# Patient Record
Sex: Male | Born: 1990 | Race: White | Hispanic: No | Marital: Married | Smoking: Never smoker
Health system: Southern US, Community
[De-identification: ages and names within clinical notes are randomized; demographics above are authoritative.]

---
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
## 2014-02-24 ENCOUNTER — Emergency Department (HOSPITAL_COMMUNITY): Payer: Worker's Compensation | Admitting: Anesthesiology

## 2014-02-24 ENCOUNTER — Emergency Department (HOSPITAL_COMMUNITY): Payer: Worker's Compensation

## 2014-02-24 ENCOUNTER — Ambulatory Visit (HOSPITAL_COMMUNITY)
Admission: EM | Admit: 2014-02-24 | Discharge: 2014-02-24 | Disposition: A | Discharge status: Home / Self Care | Payer: Worker's Compensation | Attending: Emergency Medicine | Admitting: Emergency Medicine

## 2014-02-24 ENCOUNTER — Encounter (HOSPITAL_COMMUNITY): Payer: Self-pay | Admitting: *Deleted

## 2014-02-24 ENCOUNTER — Encounter (HOSPITAL_COMMUNITY)
Admission: EM | Disposition: A | Discharge status: Home / Self Care | Payer: Self-pay | Source: Home / Self Care | Attending: Emergency Medicine

## 2014-02-24 DIAGNOSIS — Y929 Unspecified place or not applicable: Secondary | ICD-10-CM | POA: Insufficient documentation

## 2014-02-24 DIAGNOSIS — IMO0002 Reserved for concepts with insufficient information to code with codable children: Secondary | ICD-10-CM

## 2014-02-24 DIAGNOSIS — Z8614 Personal history of Methicillin resistant Staphylococcus aureus infection: Secondary | ICD-10-CM | POA: Insufficient documentation

## 2014-02-24 DIAGNOSIS — W260XXA Contact with knife, initial encounter: Secondary | ICD-10-CM | POA: Diagnosis not present

## 2014-02-24 DIAGNOSIS — F17220 Nicotine dependence, chewing tobacco, uncomplicated: Secondary | ICD-10-CM | POA: Insufficient documentation

## 2014-02-24 DIAGNOSIS — T148 Other injury of unspecified body region: Secondary | ICD-10-CM | POA: Diagnosis present

## 2014-02-24 DIAGNOSIS — S66822A Laceration of other specified muscles, fascia and tendons at wrist and hand level, left hand, initial encounter: Secondary | ICD-10-CM | POA: Diagnosis not present

## 2014-02-24 HISTORY — PX: I & D EXTREMITY: SHX5045

## 2014-02-24 LAB — SURGICAL PCR SCREEN
MRSA, PCR: NEGATIVE
STAPHYLOCOCCUS AUREUS: NEGATIVE

## 2014-02-24 SURGERY — IRRIGATION AND DEBRIDEMENT EXTREMITY
Anesthesia: General | Site: Hand | Laterality: Left

## 2014-02-24 MED ORDER — OXYCODONE HCL 5 MG/5ML PO SOLN: 5.0000 mg | Freq: Once | ORAL | Status: AC | PRN

## 2014-02-24 MED ORDER — OXYCODONE-ACETAMINOPHEN 5-325 MG PO TABS
ORAL_TABLET | ORAL | Status: AC
  Filled 2014-02-24: qty 1

## 2014-02-24 MED ORDER — FENTANYL CITRATE 0.05 MG/ML IJ SOLN
100.0000 ug | Freq: Once | INTRAMUSCULAR | Status: AC
  Administered 2014-02-24: 100 ug via INTRAVENOUS

## 2014-02-24 MED ORDER — SULFAMETHOXAZOLE-TRIMETHOPRIM 800-160 MG PO TABS: 1.0000 | ORAL_TABLET | Freq: Two times a day (BID) | ORAL | Status: AC

## 2014-02-24 MED ORDER — ONDANSETRON HCL 4 MG/2ML IJ SOLN
INTRAMUSCULAR | Status: DC | PRN
  Administered 2014-02-24: 4 mg via INTRAVENOUS

## 2014-02-24 MED ORDER — MIDAZOLAM HCL 5 MG/5ML IJ SOLN
INTRAMUSCULAR | Status: DC | PRN
  Administered 2014-02-24: 2 mg via INTRAVENOUS

## 2014-02-24 MED ORDER — FENTANYL CITRATE 0.05 MG/ML IJ SOLN
INTRAMUSCULAR | Status: DC | PRN
  Administered 2014-02-24: 50 ug via INTRAVENOUS

## 2014-02-24 MED ORDER — BUPIVACAINE HCL (PF) 0.25 % IJ SOLN
INTRAMUSCULAR | Status: DC | PRN
  Administered 2014-02-24: 10 mL

## 2014-02-24 MED ORDER — FENTANYL CITRATE 0.05 MG/ML IJ SOLN
INTRAMUSCULAR | Status: AC
  Filled 2014-02-24: qty 2

## 2014-02-24 MED ORDER — OXYCODONE HCL 5 MG PO TABS
5.0000 mg | ORAL_TABLET | Freq: Once | ORAL | Status: AC | PRN
  Administered 2014-02-24: 5 mg via ORAL

## 2014-02-24 MED ORDER — LACTATED RINGERS IV SOLN
INTRAVENOUS | Status: DC
  Administered 2014-02-24: 16:00:00 via INTRAVENOUS

## 2014-02-24 MED ORDER — PROPOFOL 10 MG/ML IV BOLUS
INTRAVENOUS | Status: AC
  Filled 2014-02-24: qty 20

## 2014-02-24 MED ORDER — HYDROMORPHONE HCL 1 MG/ML IJ SOLN
INTRAMUSCULAR | Status: AC
  Filled 2014-02-24: qty 1

## 2014-02-24 MED ORDER — PROPOFOL 10 MG/ML IV BOLUS
INTRAVENOUS | Status: DC | PRN
Start: 2014-02-24 — End: 2014-02-24
  Administered 2014-02-24: 250 mg via INTRAVENOUS

## 2014-02-24 MED ORDER — LIDOCAINE HCL (CARDIAC) 20 MG/ML IV SOLN
INTRAVENOUS | Status: AC
  Filled 2014-02-24: qty 5

## 2014-02-24 MED ORDER — GLYCOPYRROLATE 0.2 MG/ML IJ SOLN
INTRAMUSCULAR | Status: DC | PRN
  Administered 2014-02-24: 0.2 mg via INTRAVENOUS

## 2014-02-24 MED ORDER — HYDROMORPHONE HCL 1 MG/ML IJ SOLN
0.2500 mg | INTRAMUSCULAR | Status: DC | PRN
  Administered 2014-02-24 (×2): 0.5 mg via INTRAVENOUS

## 2014-02-24 MED ORDER — OXYCODONE HCL 5 MG PO TABS
ORAL_TABLET | ORAL | Status: AC
  Filled 2014-02-24: qty 1

## 2014-02-24 MED ORDER — FENTANYL CITRATE 0.05 MG/ML IJ SOLN
INTRAMUSCULAR | Status: AC
  Filled 2014-02-24: qty 5

## 2014-02-24 MED ORDER — FENTANYL CITRATE 0.05 MG/ML IJ SOLN
25.0000 ug | Freq: Once | INTRAMUSCULAR | Status: AC
  Administered 2014-02-24: 25 ug via INTRAVENOUS
  Filled 2014-02-24: qty 2

## 2014-02-24 MED ORDER — LACTATED RINGERS IV SOLN
INTRAVENOUS | Status: DC | PRN
  Administered 2014-02-24: 16:00:00 via INTRAVENOUS

## 2014-02-24 MED ORDER — MIDAZOLAM HCL 2 MG/2ML IJ SOLN
INTRAMUSCULAR | Status: AC
  Filled 2014-02-24: qty 2

## 2014-02-24 MED ORDER — SODIUM CHLORIDE 0.9 % IR SOLN
Status: DC | PRN
  Administered 2014-02-24: 1000 mL

## 2014-02-24 MED ORDER — CEFAZOLIN SODIUM-DEXTROSE 2-3 GM-% IV SOLR
INTRAVENOUS | Status: AC
  Filled 2014-02-24: qty 50

## 2014-02-24 MED ORDER — OXYCODONE-ACETAMINOPHEN 5-325 MG PO TABS
1.0000 | ORAL_TABLET | Freq: Once | ORAL | Status: AC
  Administered 2014-02-24: 1 via ORAL

## 2014-02-24 MED ORDER — MUPIROCIN 2 % EX OINT
1.0000 "application " | TOPICAL_OINTMENT | Freq: Once | CUTANEOUS | Status: AC
  Administered 2014-02-24: 1 via TOPICAL

## 2014-02-24 MED ORDER — CEFAZOLIN SODIUM-DEXTROSE 2-3 GM-% IV SOLR
2.0000 g | Freq: Once | INTRAVENOUS | Status: AC
  Administered 2014-02-24: 2 g via INTRAVENOUS
  Filled 2014-02-24: qty 50

## 2014-02-24 MED ORDER — BUPIVACAINE HCL (PF) 0.25 % IJ SOLN
INTRAMUSCULAR | Status: AC
  Filled 2014-02-24: qty 30

## 2014-02-24 MED ORDER — FENTANYL CITRATE 0.05 MG/ML IJ SOLN
INTRAMUSCULAR | Status: DC
Start: 2014-02-24 — End: 2014-02-24
  Filled 2014-02-24: qty 2

## 2014-02-24 MED ORDER — MUPIROCIN 2 % EX OINT
TOPICAL_OINTMENT | CUTANEOUS | Status: AC
  Administered 2014-02-24: 1 via TOPICAL
  Filled 2014-02-24: qty 22

## 2014-02-24 MED ORDER — OXYCODONE-ACETAMINOPHEN 5-325 MG PO TABS: 2.0000 | ORAL_TABLET | ORAL | Status: AC | PRN

## 2014-02-24 MED ORDER — CEFAZOLIN SODIUM-DEXTROSE 2-3 GM-% IV SOLR
INTRAVENOUS | Status: DC | PRN
  Administered 2014-02-24: 2 g via INTRAVENOUS

## 2014-02-24 MED ORDER — LIDOCAINE HCL (CARDIAC) 20 MG/ML IV SOLN
INTRAVENOUS | Status: DC | PRN
  Administered 2014-02-24: 80 mg via INTRAVENOUS

## 2014-02-24 SURGICAL SUPPLY — 43 items
BANDAGE ELASTIC 3 VELCRO ST LF (GAUZE/BANDAGES/DRESSINGS) ×2 IMPLANT
BANDAGE ELASTIC 4 VELCRO ST LF (GAUZE/BANDAGES/DRESSINGS) ×3 IMPLANT
BNDG CONFORM 2 STRL LF (GAUZE/BANDAGES/DRESSINGS) IMPLANT
BNDG GAUZE ELAST 4 BULKY (GAUZE/BANDAGES/DRESSINGS) ×5 IMPLANT
CORDS BIPOLAR (ELECTRODE) ×3 IMPLANT
CUFF TOURNIQUET SINGLE 18IN (TOURNIQUET CUFF) ×3 IMPLANT
DRSG ADAPTIC 3X8 NADH LF (GAUZE/BANDAGES/DRESSINGS) ×3 IMPLANT
ELECT REM PT RETURN 9FT ADLT (ELECTROSURGICAL)
ELECTRODE REM PT RTRN 9FT ADLT (ELECTROSURGICAL) IMPLANT
GAUZE SPONGE 4X4 12PLY STRL (GAUZE/BANDAGES/DRESSINGS) ×3 IMPLANT
GAUZE XEROFORM 1X8 LF (GAUZE/BANDAGES/DRESSINGS) ×3 IMPLANT
GLOVE BIOGEL M STRL SZ7.5 (GLOVE) ×3 IMPLANT
GLOVE SS BIOGEL STRL SZ 8 (GLOVE) ×1 IMPLANT
GLOVE SUPERSENSE BIOGEL SZ 8 (GLOVE) ×2
GOWN STRL REUS W/ TWL LRG LVL3 (GOWN DISPOSABLE) ×3 IMPLANT
GOWN STRL REUS W/ TWL XL LVL3 (GOWN DISPOSABLE) ×3 IMPLANT
GOWN STRL REUS W/TWL LRG LVL3 (GOWN DISPOSABLE) ×9
GOWN STRL REUS W/TWL XL LVL3 (GOWN DISPOSABLE) ×9
KIT BASIN OR (CUSTOM PROCEDURE TRAY) ×3 IMPLANT
KIT ROOM TURNOVER OR (KITS) ×3 IMPLANT
MANIFOLD NEPTUNE II (INSTRUMENTS) ×3 IMPLANT
NDL HYPO 25GX1X1/2 BEV (NEEDLE) IMPLANT
NEEDLE HYPO 25GX1X1/2 BEV (NEEDLE) IMPLANT
NS IRRIG 1000ML POUR BTL (IV SOLUTION) ×3 IMPLANT
PACK ORTHO EXTREMITY (CUSTOM PROCEDURE TRAY) ×3 IMPLANT
PAD ARMBOARD 7.5X6 YLW CONV (MISCELLANEOUS) ×6 IMPLANT
PAD CAST 4YDX4 CTTN HI CHSV (CAST SUPPLIES) ×1 IMPLANT
PADDING CAST ABS 3INX4YD NS (CAST SUPPLIES) ×2
PADDING CAST ABS COTTON 3X4 (CAST SUPPLIES) IMPLANT
PADDING CAST COTTON 4X4 STRL (CAST SUPPLIES) ×3
SPONGE GAUZE 4X4 12PLY STER LF (GAUZE/BANDAGES/DRESSINGS) ×2 IMPLANT
SPONGE LAP 18X18 X RAY DECT (DISPOSABLE) ×3 IMPLANT
SPONGE LAP 4X18 X RAY DECT (DISPOSABLE) ×3 IMPLANT
SUT FIBERWIRE 4-0 18 TAPR NDL (SUTURE) ×6
SUTURE FIBERWR 4-0 18 TAPR NDL (SUTURE) IMPLANT
SYR CONTROL 10ML LL (SYRINGE) IMPLANT
TOWEL OR 17X24 6PK STRL BLUE (TOWEL DISPOSABLE) ×3 IMPLANT
TOWEL OR 17X26 10 PK STRL BLUE (TOWEL DISPOSABLE) ×3 IMPLANT
TUBE CONNECTING 12'X1/4 (SUCTIONS) ×1
TUBE CONNECTING 12X1/4 (SUCTIONS) ×2 IMPLANT
TUBING CYSTO DISP (UROLOGICAL SUPPLIES) ×2 IMPLANT
WATER STERILE IRR 1000ML POUR (IV SOLUTION) ×3 IMPLANT
YANKAUER SUCT BULB TIP NO VENT (SUCTIONS) ×1 IMPLANT

## 2014-02-24 NOTE — Transfer of Care (Signed)
Immediate Anesthesia Transfer of Care Note  Patient: Clarence Cameron  Procedure(s) Performed: Procedure(s): IRRIGATION AND DEBRIDEMENT LEFT HAND, EXPLORATION, TENDON REPAIR (Left)  Patient Location: PACU  Anesthesia Type:General  Level of Consciousness: sedated  Airway & Oxygen Therapy: Patient Spontanous Breathing and Patient connected to nasal cannula oxygen  Post-op Assessment: Report given to PACU RN, Post -op Vital signs reviewed and stable and Patient moving all extremities X 4  Post vital signs: Reviewed and stable  Complications: No apparent anesthesia complications

## 2014-02-24 NOTE — ED Notes (Signed)
Manual pressure applied to wound area per RN

## 2014-02-24 NOTE — H&P (Signed)
Clarence KaufmannJustin R Cameron is an 24 y.o. male.   Chief Complaint: Cut my left hand HPI: The patient is a pleasant 24 yo male presents for evaluation of his left hand after an [employer], the patient cut the first web space of the left hand while cutting foam board insulation. Patient noted significant bleeding to the area and was brought to the ER for further evaluation. Initial evaluation per the ER staff was concerning for arterial bleed. Thus pressure dressing applied. The patient remained hemodynamically stable. On our current evaluation, he denies numbness or tingling of the digits. He denies any other injury. Tetanus UTD(within 2 years). Ancef 2 grams currently being administrated. Patient has no other complaints.  History reviewed. No pertinent past medical history.  History reviewed. No pertinent past surgical history.  No family history on file. Social History:  reports that he has never smoked. His smokeless tobacco use includes Snuff. He reports that he does not drink alcohol or use illicit drugs.  Allergies: No Known Allergies   (Not in a hospital admission)  No results found for this or any previous visit (from the past 48 hour(s)). Dg Hand Complete Left  02/24/2014   CLINICAL DATA:  Open injury left hand today. Injured with knife. Initial evaluation.  EXAM: LEFT HAND - COMPLETE 3+ VIEW  COMPARISON:  None.  FINDINGS: Soft tissue swelling with subcutaneous air noted over the interspace between the left first and second digits. No acute bony or joint abnormality identified. No radiopaque foreign body.  IMPRESSION: Soft tissue injury over the interspace between the left first and second metacarpals. No radiopaque foreign body. No acute bony abnormality.   Electronically Signed   By: Maisie Fushomas  Register   On: 02/24/2014 13:17    Review of Systems  Constitutional: Negative.   HENT: Negative.   Eyes: Negative.   Respiratory: Negative.   Cardiovascular: Negative.    Gastrointestinal: Negative.   Musculoskeletal:       See HPI  Skin: Negative.   Neurological: Negative.   Endo/Heme/Allergies: Negative.     Blood pressure 134/74, pulse 58, temperature 97.8 F (36.6 C), temperature source Oral, resp. rate 17, height 6\' 3"  (1.905 m), weight 102.059 kg (225 lb), SpO2 100 %. Physical Exam  ..The patient is alert and oriented in no acute distress the patient complains of pain in the affected upper extremity.  The patient is noted to have a normal HEENT exam.  Lung fields show equal chest expansion and no shortness of breath  abdomen exam is nontender without distention.  Lower extremity examination does not show any fracture dislocation or blood clot symptoms.  Pelvis is stable neck and back are stable and nontender Evaluation of the left hand: 5 to 5.5 cm laceration in the 1st web space with obvious muscle involvement, currently bleeding has ceased, thumb is neurovascularly intact, remaining digits well perfused, radial pulse intact, FPL/EPL intact, FDS/FDP intact to IF thru SF.  Assessment/Plan Left hand laceration first web space, complex in nature r/o Princeps Pollicus injury Hx of smokeless tobacco use Plan:  [name].[name].We are planning surgery for your upper extremity. The risk and benefits of surgery include risk of bleeding infection anesthesia damage to normal structures and failure of the surgery to accomplish its intended goals of relieving symptoms and restoring function with this in mind we'll going to proceed. I have specifically discussed with the patient the pre-and postoperative regime and the does and don'ts and risk and benefits in great detail. Risk and  benefits of surgery also include risk of dystrophy chronic nerve pain failure of the healing process to go onto completion and other inherent risks of surgery The relavent the pathophysiology of the disease/injury process, as well as the alternatives for treatment and postoperative course of action  has been discussed in great detail with the patient who desires to proceed. The left hand wound was copiously irrigated using 4 liter of sterile water, following this wet to dry, bulky dressings applied. Hand elevated above heart. All questions were encouraged and answered.  We will do everything in our power to help you (the patient) restore function to the upper extremity. Is a pleasure to see this patient today.   [name] 02/24/2014, 2:09 PM

## 2014-02-24 NOTE — Discharge Instructions (Signed)

## 2014-02-24 NOTE — ED Notes (Signed)
Pt arrives via EMS from job site via EMS. Pt was cutting insulation at work with a pocket knife and accidentally sliced through his palm. EMS reports a through and through laceration to left palm near thumb region. EMS has hand wrapped. VSS.

## 2014-02-24 NOTE — Anesthesia Procedure Notes (Signed)
Procedure Name: LMA Insertion Date/Time: 02/24/2014 5:53 PM Performed by: Orvilla FusATO, Calan Doren A Pre-anesthesia Checklist: Patient identified, Timeout performed, Emergency Drugs available, Suction available and Patient being monitored Patient Re-evaluated:Patient Re-evaluated prior to inductionOxygen Delivery Method: Circle system utilized Preoxygenation: Pre-oxygenation with 100% oxygen Intubation Type: IV induction Ventilation: Mask ventilation without difficulty LMA: LMA inserted LMA Size: 5.0 Number of attempts: 1 Placement Confirmation: positive ETCO2 and breath sounds checked- equal and bilateral Tube secured with: Tape Dental Injury: Teeth and Oropharynx as per pre-operative assessment

## 2014-02-24 NOTE — ED Notes (Signed)
Belongings given to friend including wedding ring.

## 2014-02-24 NOTE — ED Provider Notes (Signed)
24 year old male, presents with left hand injury after  accidentally cutting his hand with a knife while trying to slice through insulation at work.on exam he has a tender laceration that extends between the first and second digit and onto the dorsum of the left hand, there is active arterial bleeding, significant venous oozing, exposed deep muscle and deep structures.  There is limited testing of range of motion secondary to pain and depth of laceration, he has normal heart and lung sounds, he appears otherwise well and has no significant past medical history.  To be taken to OR by Dr. Amanda PeaGramig  Medical screening examination/treatment/procedure(s) were conducted as a shared visit with non-physician practitioner(s) and myself.  I personally evaluated the patient during the encounter.  Clinical Impression:   Final diagnoses:  None         Vida RollerBrian D Stewart Sasaki, MD 02/25/14 [phone]

## 2014-02-24 NOTE — ED Notes (Signed)
Hand surgeon at bedside; hand being washed and redressed.

## 2014-02-24 NOTE — Progress Notes (Signed)
Patient notified of delay.  

## 2014-02-24 NOTE — ED Notes (Addendum)
PA at bedside. MD at bedside.

## 2014-02-24 NOTE — ED Notes (Signed)
PT signed consent form.

## 2014-02-24 NOTE — Anesthesia Postprocedure Evaluation (Signed)
  Anesthesia Post-op Note  Patient: Clarence FarberJustin R Philyaw  Procedure(s) Performed: Procedure(s): IRRIGATION AND DEBRIDEMENT LEFT HAND, EXPLORATION, TENDON REPAIR (Left)  Patient Location: PACU  Anesthesia Type:General  Level of Consciousness: awake, alert  and oriented  Airway and Oxygen Therapy: Patient Spontanous Breathing and Patient connected to nasal cannula oxygen  Post-op Pain: mild  Post-op Assessment: Post-op Vital signs reviewed, Patient's Cardiovascular Status Stable, Respiratory Function Stable, Patent Airway, No signs of Nausea or vomiting and Pain level controlled  Post-op Vital Signs: stable  Last Vitals:  Filed Vitals:   02/24/14 1956  BP:   Pulse: 69  Temp: 36.6 C  Resp: 11    Complications: No apparent anesthesia complications

## 2014-02-24 NOTE — Op Note (Signed)
See [id number]#[id number]#[id number] Clarence PeaGramig MD

## 2014-02-24 NOTE — ED Provider Notes (Signed)
CSN: [id number]     Arrival date & time 02/24/14  1217 History   First MD Initiated Contact with Patient 02/24/14 1219     Chief Complaint  Patient presents with  . Extremity Laceration     (Consider location/radiation/quality/duration/timing/severity/associated sxs/prior Treatment) HPI Comments: Patient is a 24 year old male presenting to the Emergency Department with a chief complaint of hand laceration occurring approximately 1130 today. Patient reports while at work trying to cut insulation with a pocket knife and cutting his left hand in between first and second digit. Reports throbbing discomfort, worsen with movement. Patient reports a large amount of bleeding. Right hand dominate. Last tetanus 2 years ago.  The history is provided by the patient. No language interpreter was used.    No past medical history on file. No past surgical history on file. No family history on file. History  Substance Use Topics  . Smoking status: Not on file  . Smokeless tobacco: Not on file  . Alcohol Use: Not on file    Review of Systems  Musculoskeletal: Positive for arthralgias.  Skin: Positive for wound.      Allergies  Review of patient's allergies indicates not on file.  Home Medications   Prior to Admission medications   Not on File   Ht 6\' 3"  (1.905 m)  Wt 225 lb (102.059 kg)  BMI 28.12 kg/m2 Physical Exam  Constitutional: He is oriented to person, place, and time. He appears well-developed and well-nourished. No distress.  HENT:  Head: Normocephalic and atraumatic.  Neck: Neck supple.  Cardiovascular: Normal rate and regular rhythm.   Pulmonary/Chest: Effort normal. No respiratory distress. He has no wheezes.  Musculoskeletal:  Left hand: Deep laceration to the first web space. Arterial bleeding noted. Muscle and tendons exposed. Patient is able to move all fingers slightly, Full ROM was not tested.  Neurological: He is alert and oriented to person, place, and time.   Skin: Skin is warm.  Psychiatric: He has a normal mood and affect. His behavior is normal.  Nursing note and vitals reviewed.   ED Course  Procedures (including critical care time) Labs Review Labs Reviewed - No data to display  Imaging Review Dg Hand Complete Left  02/24/2014   CLINICAL DATA:  Open injury left hand today. Injured with knife. Initial evaluation.  EXAM: LEFT HAND - COMPLETE 3+ VIEW  COMPARISON:  None.  FINDINGS: Soft tissue swelling with subcutaneous air noted over the interspace between the left first and second digits. No acute bony or joint abnormality identified. No radiopaque foreign body.  IMPRESSION: Soft tissue injury over the interspace between the left first and second metacarpals. No radiopaque foreign body. No acute bony abnormality.   Electronically Signed   By: Maisie Fushomas  Register   On: 02/24/2014 13:17     EKG Interpretation None      MDM   Final diagnoses:  Laceration   Pt with deep laceration to left hand, concerning for arterial bleed. Dr. Hyacinth MeekerMiller also evaluated the patient during this encounter.   Hand spcialist paged. Pain medication given, Td UTD. Discussed with hand specailist, agrees to evaluate the patient in the ED.  XR shows soft tissue wound, no bone involvement. 1410 Pt was evaluated by Areatha KeasBuchanan, PA-C, after exam The patient will need a irrigation and debridement of wound in the OR.  Pt received Ancef 2g IV.    Mellody DrownLauren Jazzalyn Loewenstein, PA-C 02/24/14 1436  Vida RollerBrian D Miller, MD 02/25/14 [phone]

## 2014-02-24 NOTE — Anesthesia Preprocedure Evaluation (Addendum)
Anesthesia Evaluation  Patient identified by MRN, date of birth, ID band Patient awake    Reviewed: Allergy & Precautions, NPO status , Patient's Chart, lab work & pertinent test results  History of Anesthesia Complications Negative for: history of anesthetic complications  Airway Mallampati: II  TM Distance: >3 FB Neck ROM: Full    Dental  (+) Dental Advisory Given, Teeth Intact, Chipped,    Pulmonary neg pulmonary ROS,  breath sounds clear to auscultation        Cardiovascular negative cardio ROS  Rhythm:Regular     Neuro/Psych negative neurological ROS     GI/Hepatic negative GI ROS, Neg liver ROS,   Endo/Other  negative endocrine ROS  Renal/GU negative Renal ROS     Musculoskeletal negative musculoskeletal ROS (+)   Abdominal   Peds  Hematology negative hematology ROS (+)   Anesthesia Other Findings   Reproductive/Obstetrics                          Anesthesia Physical Anesthesia Plan  ASA: I  Anesthesia Plan: General   Post-op Pain Management:    Induction: Intravenous  Airway Management Planned: LMA  Additional Equipment: None  Intra-op Plan:   Post-operative Plan: Extubation in OR  Informed Consent: I have reviewed the patients History and Physical, chart, labs and discussed the procedure including the risks, benefits and alternatives for the proposed anesthesia with the patient or authorized representative who has indicated his/her understanding and acceptance.   Dental advisory given  Plan Discussed with: CRNA, Anesthesiologist and Surgeon  Anesthesia Plan Comments:        Anesthesia Quick Evaluation

## 2014-02-25 ENCOUNTER — Encounter (HOSPITAL_COMMUNITY): Payer: Self-pay | Admitting: Orthopedic Surgery

## 2014-02-25 NOTE — Op Note (Signed)
NAMCipriano Bunker:  [place], Jadiel                ACCOUNT NO.:  [id number]  MEDICAL RECORD NO.:  [id number]  LOCATION:  [facility]                         FACILITY:  [facility]  PHYSICIAN:  Dionne AnoWilliam M. Alayssa Flinchum, M.D.DATE OF BIRTH:  [birth year]  DATE OF PROCEDURE:  02/24/2014 DATE OF DISCHARGE:  02/24/2014                              OPERATIVE REPORT   PREOPERATIVE DIAGNOSIS:  Laceration, left first thenar web with significant bleeding and musculotendinous injury.  POSTOPERATIVE DIAGNOSIS:  Laceration to the first dorsal interosseous and adductor pollicis with intact princeps pollicis artery.  SURGICAL PROCEDURE PERFORMED: 1. Irrigation and debridement of skin, subcutaneous tissue, muscle,     and tendon.  This was an excisional debridement performed with     knife, blade, curette, and scissors. 2. Exploration of princeps pollicis artery extensive in nature, which     was noted to be intact.  This was decompressed. 3. Adductor pollicis repair with FiberWire technique, left hand. 4. First dorsal interosseous tendon repair, left hand. 5. Complex wound closure, 8 cm to 10 cm left hand.  SURGEON:  Dionne AnoWilliam M. Amanda PeaGramig, M.D.  ASSISTANT:  Karie ChimeraBrian Buchanan, PA-C.  COMPLICATION:  None.  ANESTHESIA:  General.  TOURNIQUET TIME:  Less than 0.5 hour.  INDICATIONS:  A 24 year old male who presents for the above-mentioned diagnosis.  He was injured on the job.  He had profuse bleeding.  This was controlled once we applied a significant direct pressure.  He had significant injury.  Preoperatively, his neuro capabilities appeared to be intact in terms of his sensory exam.  OPERATIVE DESCRIPTION:  The patient was taken to procedure suite, underwent a thorough induction of general anesthetic, was laid supine, fully padded, prepped and draped in usual sterile fashion with Betadine scrub and paint.  Following this, outline marks were not needed and the tourniquet was insufflated to 250 mmHg.  At this juncture, we  performed irrigation and debridement of skin, subcutaneous tissue, tendon, and muscle.  This was done with curette, knife, blade, and scissor.  3 L were placed through and through.  There were no complicating features.  Following this, we then very carefully and cautiously performed an approach for purposes of dissection.  The princeps pollicis artery was identified, decompressed nicely.  This was a significant dissection given the hematoma and deep laceration.  Fortunately, the major portion of the deep laceration was just distal to its palmar directed course towards the thumb.  I decompressed the artery and was pleased to see that we did not have to do a repair.  Following this, I then retrieved the adductor distally in the palm and placed a FiberWire stitch around it and then with modified [place], we repaired it to its insertion.  There was actually good stump of tendon to repair and I was pleased to see this.  The repair was nice and tight. There were no complicating features.  Following this, we then performed very careful and cautious repair the first dorsal interosseous with 4-0 FiberWire technique and the fascial closure here was performed.  The tourniquet was deflated.  Hemostasis secured.  Following this, complex wound closure with Prolene was accomplished followed by application of sterile bandage and a splint.  The  patient tolerated this well.  There were no complicating features.  Once this was complete, we then performed extubation of the general anesthetic and he was taken to recovery room.  He was given 2 additional grams of Ancef.  We are going to discharge him on Bactrim 1 p.o. b.i.d. if he has a remote history of MRSA.  We are going to also have him on oxycodone, out of work, see [place] back in the office in 2 weeks and downstairs therapy immediately following.  I want to hold the adductor from firing thus we need to avoid pulp-to- pulp and key pinch activities.  We  will go ahead and move the FPL and EPL and the finger flexors and extensors.  We are going to hold the tendon repair still for 6 weeks and then will start firing and re- training these regions.  These notes have been discussed.  All questions have been encouraged and answered.     Dionne Ano. Amanda Pea, M.D.     [facility]  D:  02/24/2014  T:  02/25/2014  Job:  [id number]

## 2015-12-10 IMAGING — CR DG HAND COMPLETE 3+V*L*
3 series · 3 of 3 positions shown · non-contrast
Comparison: None.

CLINICAL DATA: Open injury left hand today. Injured with knife.
Initial evaluation.

EXAM:
LEFT HAND - COMPLETE 3+ VIEW

[pa]
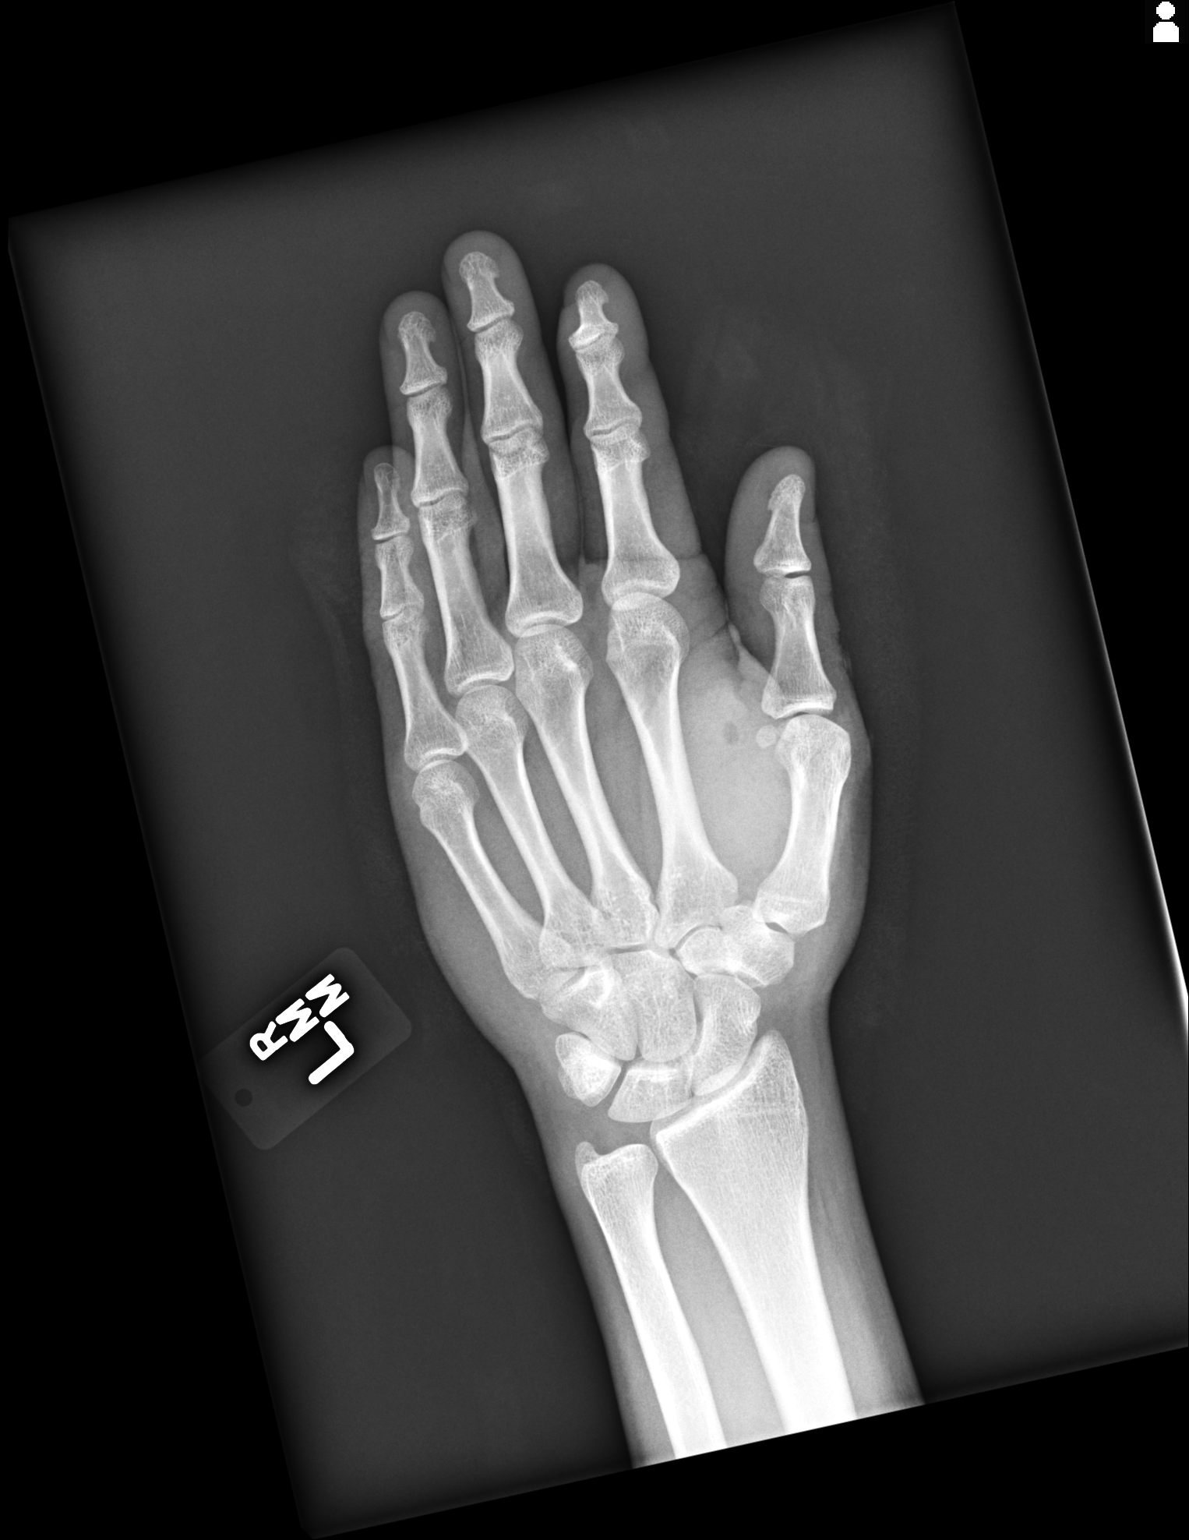

[oblique]
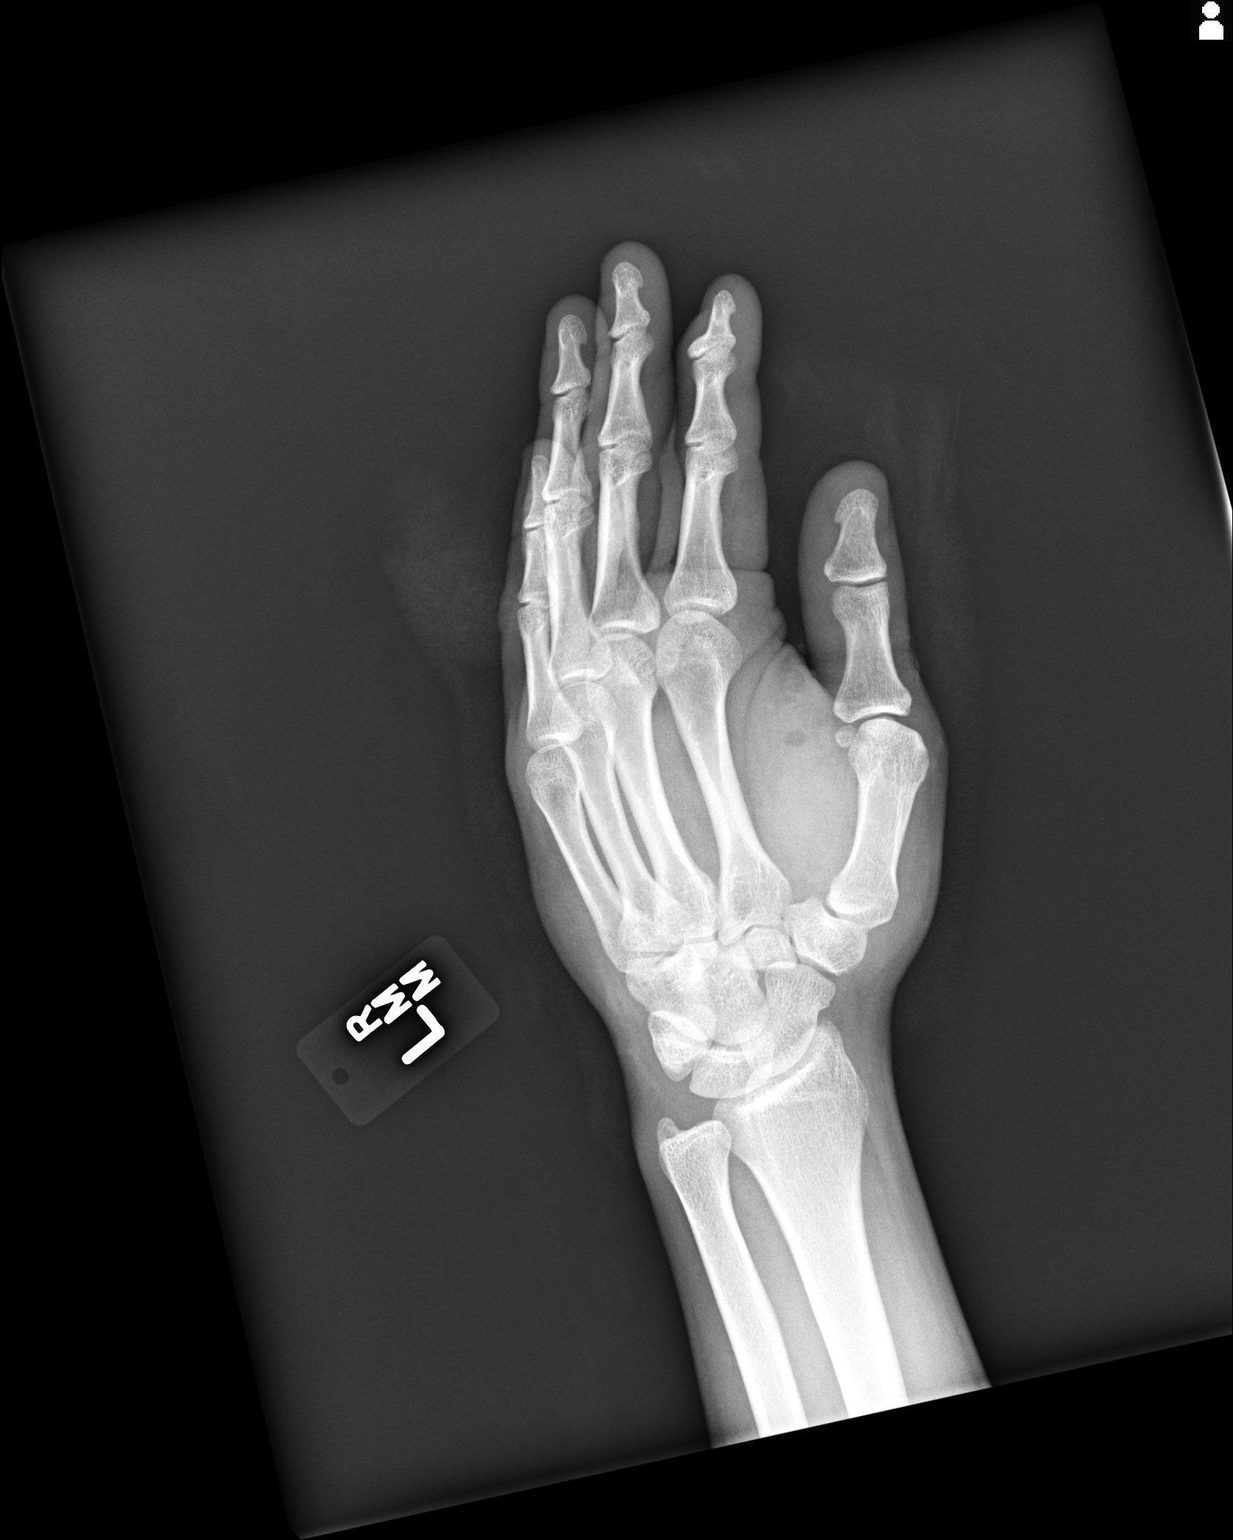

[lat]
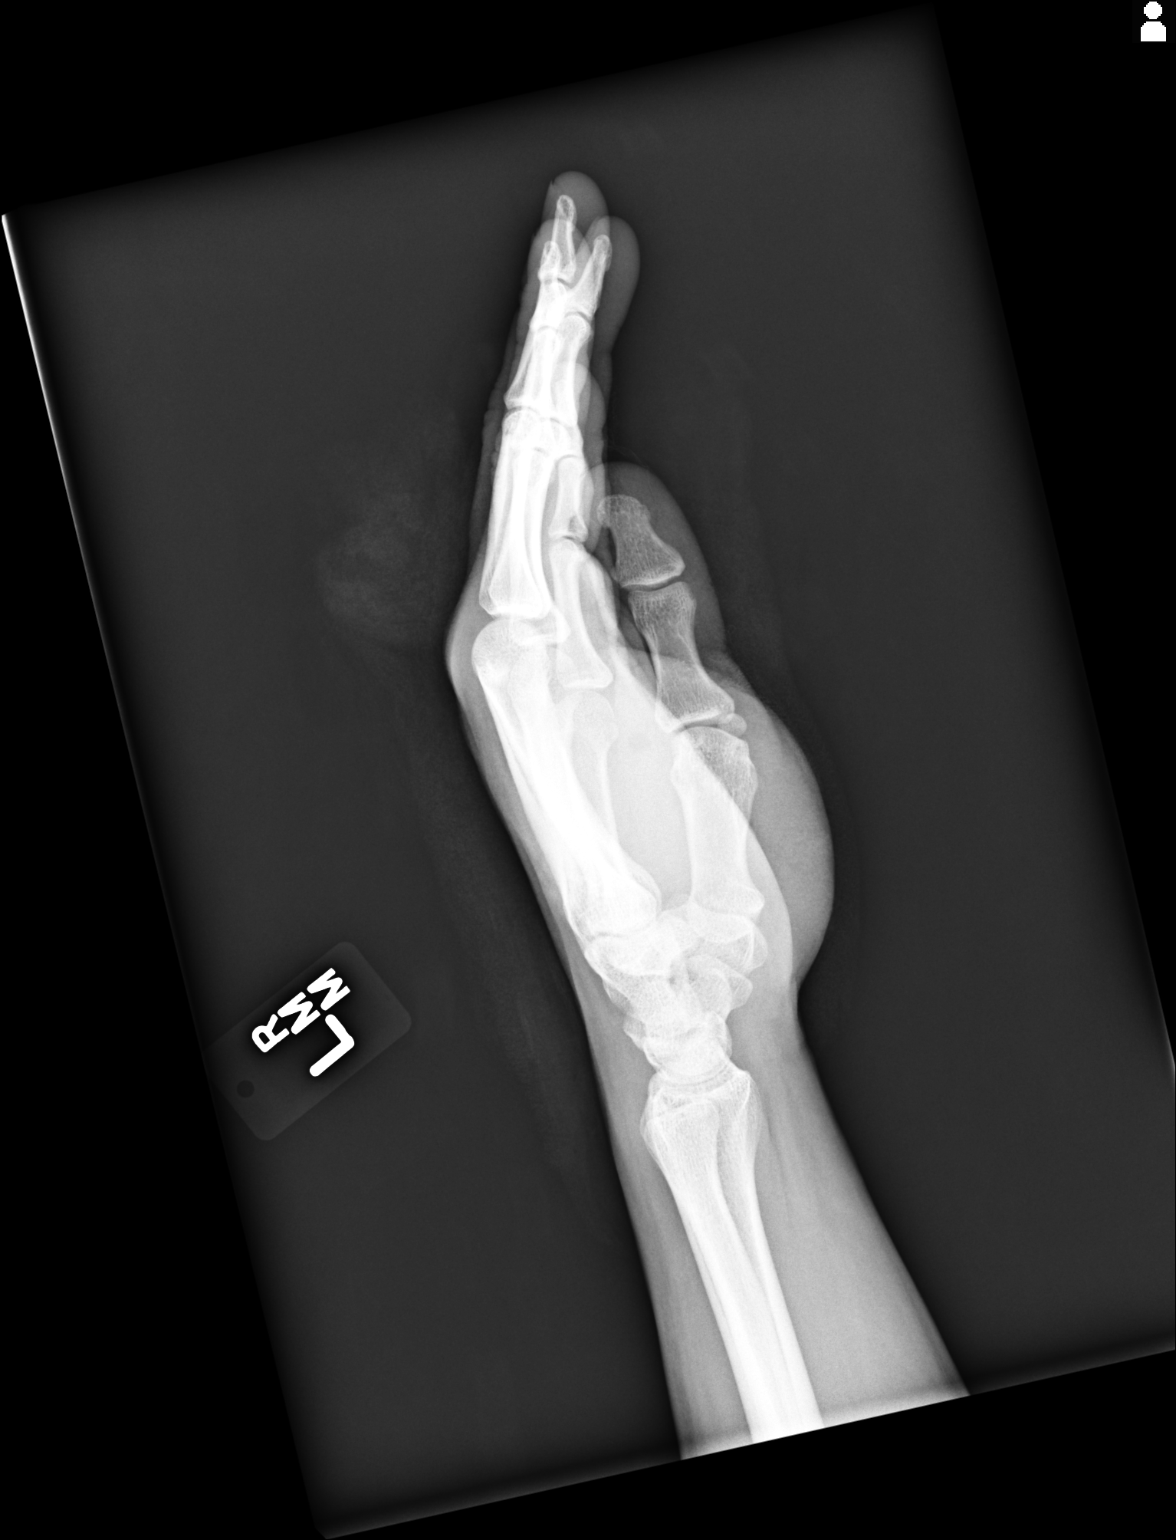

[3 of 3 positions shown; findings below may reference images not displayed]

FINDINGS: Soft tissue swelling with subcutaneous air noted over the interspace
between the left first and second digits. No acute bony or joint
abnormality identified. No radiopaque foreign body.
IMPRESSION: Soft tissue injury over the interspace between the left first and
second metacarpals. No radiopaque foreign body. No acute bony
abnormality.

## 2020-03-18 ENCOUNTER — Other Ambulatory Visit: Payer: Self-pay | Admitting: Family Medicine

## 2020-03-18 ENCOUNTER — Other Ambulatory Visit: Payer: Self-pay | Admitting: Neurological Surgery

## 2020-03-18 DIAGNOSIS — R0789 Other chest pain: Secondary | ICD-10-CM

## 2020-04-09 ENCOUNTER — Ambulatory Visit
Admission: RE | Admit: 2020-04-09 | Discharge: 2020-04-09 | Disposition: A | Discharge status: Home / Self Care | Payer: No Typology Code available for payment source | Source: Ambulatory Visit | Attending: Family Medicine | Admitting: Family Medicine

## 2020-04-09 DIAGNOSIS — R0789 Other chest pain: Secondary | ICD-10-CM

## 2022-01-23 IMAGING — CT CT CARDIAC CORONARY ARTERY CALCIUM SCORE
3 series · 14 of 20 positions shown, 16 images · non-contrast
Comparison: None.

CLINICAL DATA: 29-year-old with history of chest tightness.

EXAM:
CT CARDIAC CORONARY ARTERY CALCIUM SCORE
TECHNIQUE: Non-contrast imaging through the heart was performed using
prospective ECG gating. Image post processing was performed on an
independent workstation, allowing for quantitative analysis of the
heart and coronary arteries. Note that this exam targets the heart
and the chest was not imaged in its entirety.

[Series 2: calcium scoring 2.00 qr36 bestdiast 68% hrt calciu · axial · 0.43mm/px · z∈[+1725,+1809]mm · 4 of 70 slices shown]
[im 14/70  vessel]
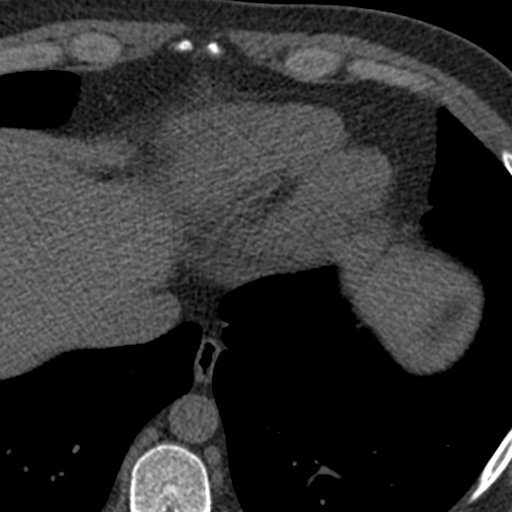
[im 28/70  vessel]
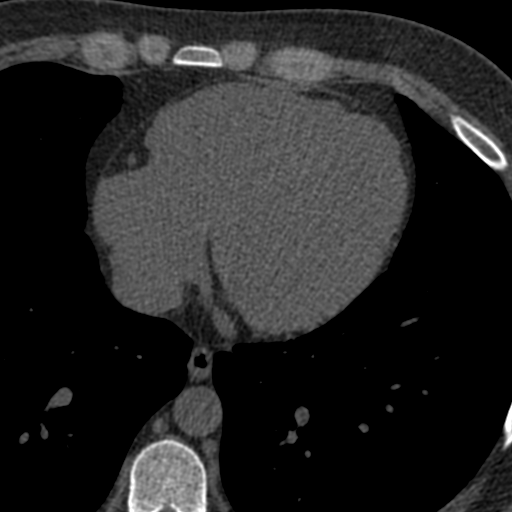
[im 42/70  vessel]
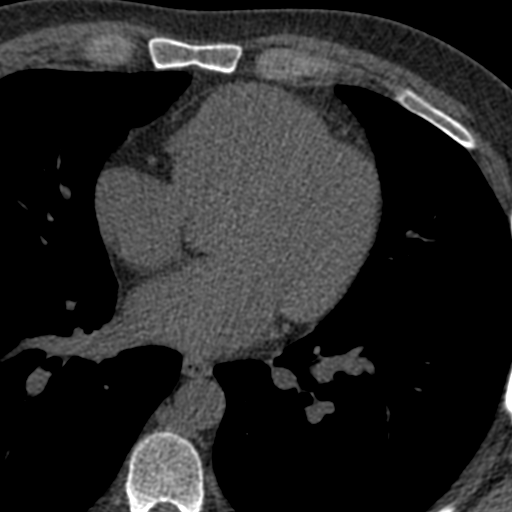
[im 56/70  vessel]
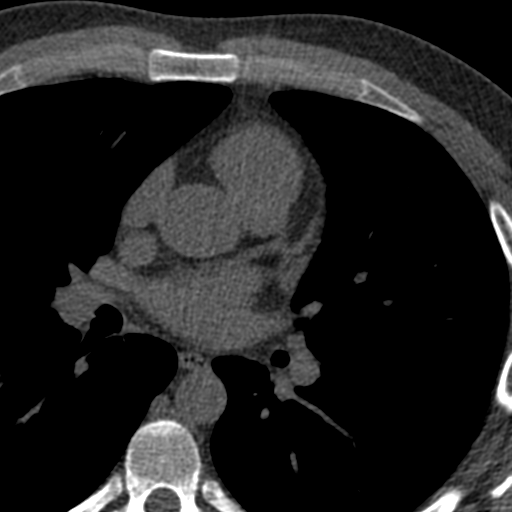

[Series 3: calcium scoring 2.00 br40 bestdiast 68% axial · axial · 0.67mm/px · z∈[+1721,+1813]mm · 5 of 70 slices shown, 7 images]
[im 12/70  vessel]
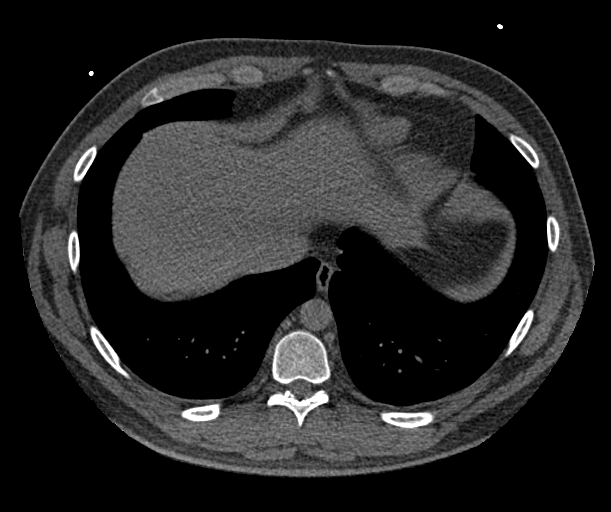
[im 12/70  lung]
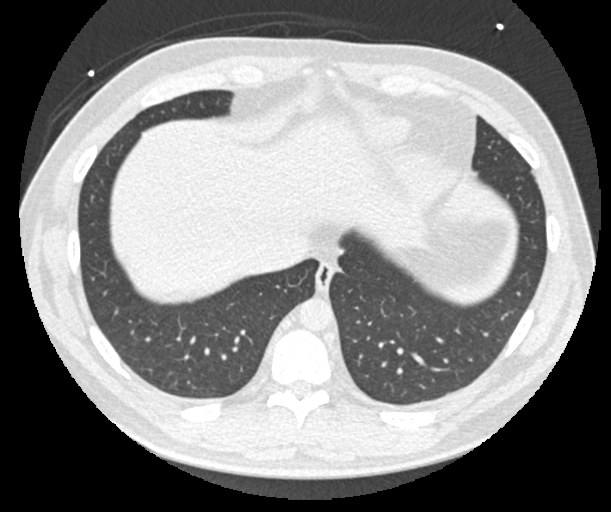
[im 24/70  vessel]
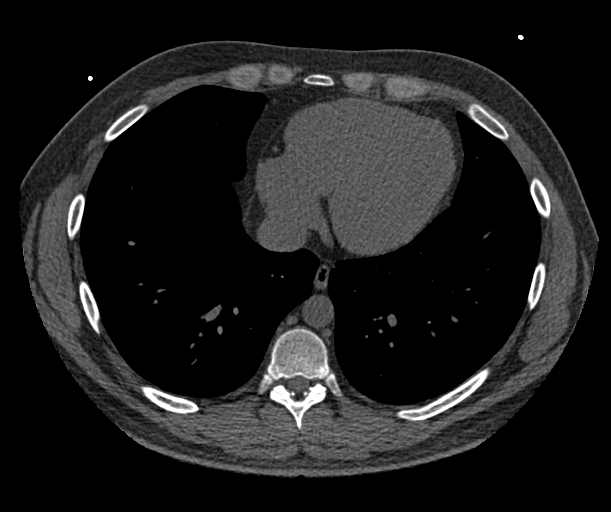
[im 35/70  vessel]
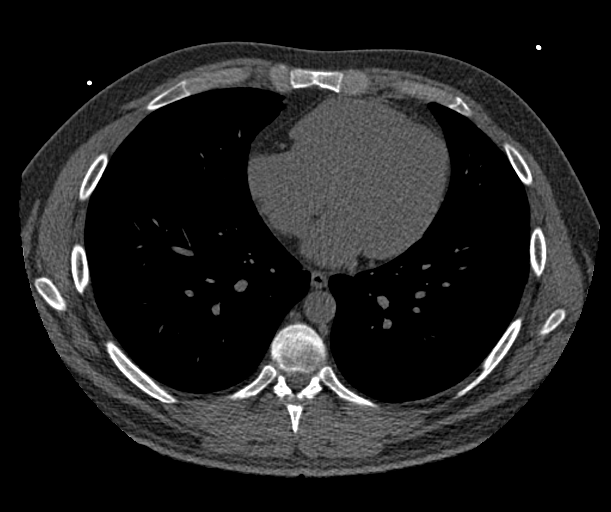
[im 47/70  vessel]
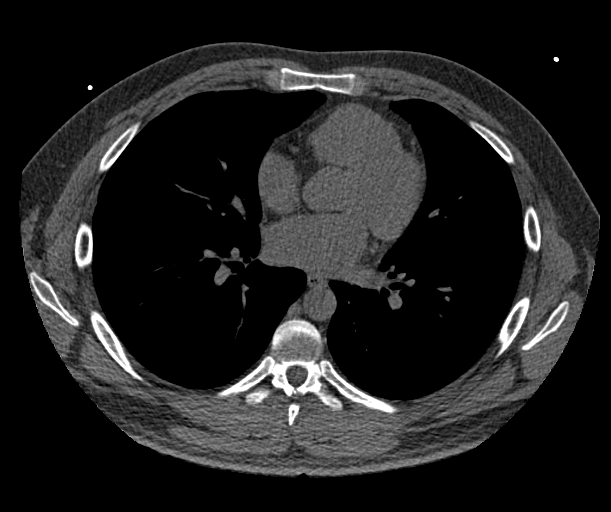
[im 58/70  vessel]
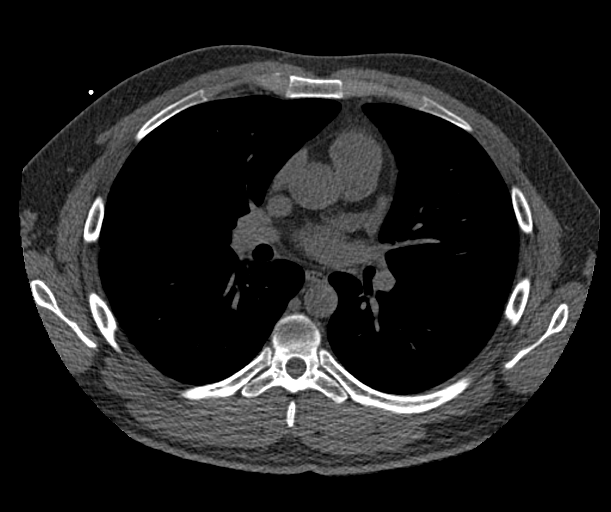
[im 58/70  lung]
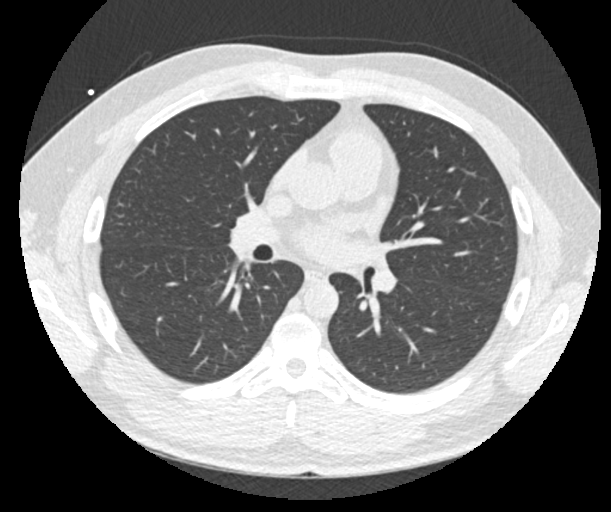

[Series 9: calcium scoring 2.00 br60 bestdiast 68% lungs · axial · 0.67mm/px · z∈[+1721,+1813]mm · 5 of 70 slices shown]
[im 12/70  vessel]
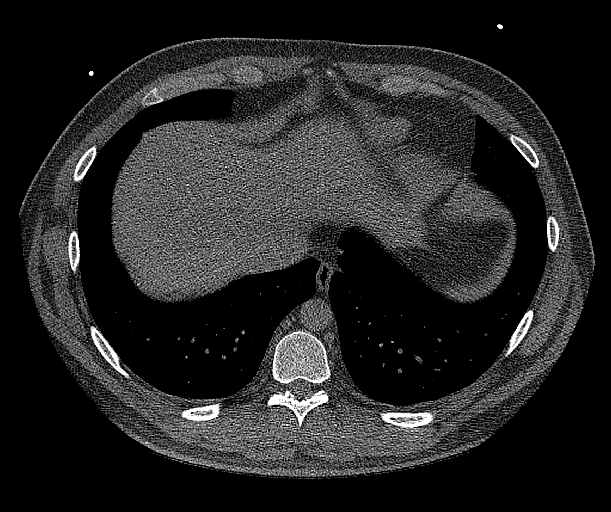
[im 24/70  vessel]
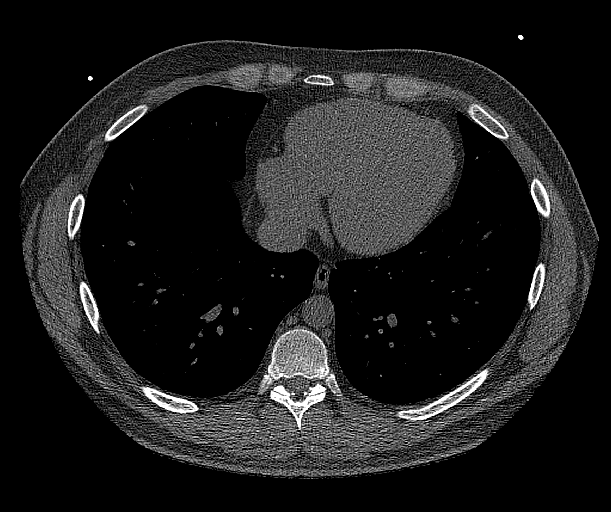
[im 35/70  vessel]
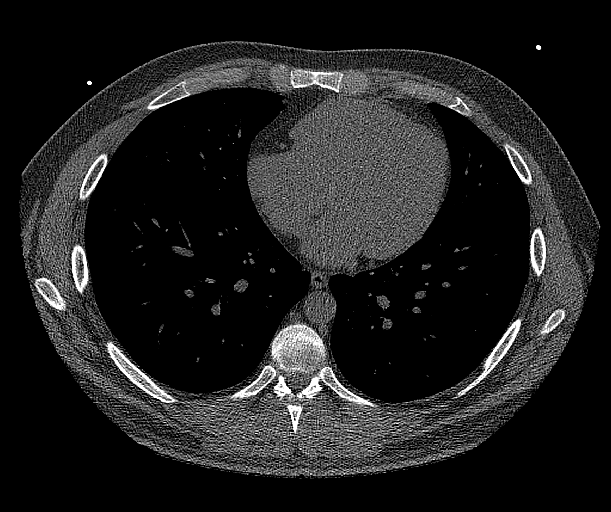
[im 47/70  vessel]
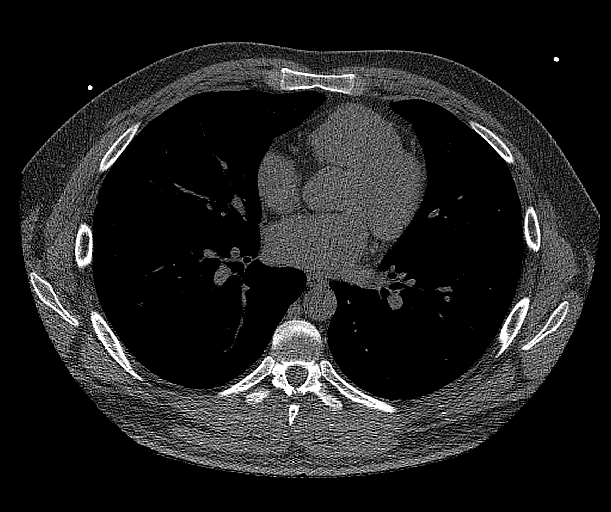
[im 58/70  vessel]
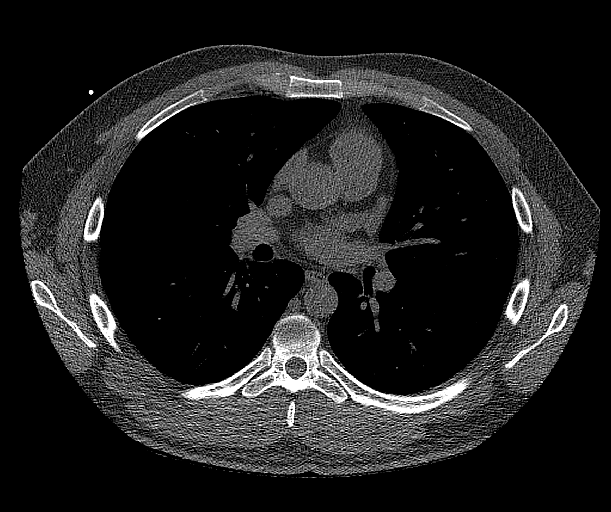

[14 of 20 positions shown; findings below may reference images not displayed]

FINDINGS: CORONARY CALCIUM SCORES:

Left Main: 0

LAD: 0

LCx: 0

RCA: 0

Total Agatston Score: 0

[HOSPITAL] percentile: 0

AORTA MEASUREMENTS:

Ascending Aorta: 29 mm

Descending Aorta: 24 mm

OTHER FINDINGS:

Visualized mediastinal and hilar structures are unremarkable. No
chest lymphadenopathy. Images of the upper abdomen are unremarkable.
Visualized lungs are clear without large pleural effusions. Bone
structures are unremarkable.
IMPRESSION: Coronary calcium score is 0.
